# Patient Record
Sex: Female | Born: 1965 | Race: Black or African American | Hispanic: No | Marital: Married | Smoking: Never smoker
Health system: Southern US, Community
[De-identification: ages and names within clinical notes are randomized; demographics above are authoritative.]

## PROBLEM LIST (undated history)

## (undated) DIAGNOSIS — E039 Hypothyroidism, unspecified: Secondary | ICD-10-CM

## (undated) DIAGNOSIS — M1990 Unspecified osteoarthritis, unspecified site: Secondary | ICD-10-CM

## (undated) HISTORY — PX: TUBAL LIGATION: SHX77

## (undated) HISTORY — PX: ABDOMINAL HYSTERECTOMY: SHX81

## (undated) HISTORY — PX: DILATION AND CURETTAGE OF UTERUS: SHX78

## (undated) HISTORY — PX: APPENDECTOMY: SHX54

---

## 2017-02-16 ENCOUNTER — Other Ambulatory Visit: Payer: Self-pay | Admitting: Neurosurgery

## 2017-03-29 ENCOUNTER — Encounter (HOSPITAL_COMMUNITY): Payer: Self-pay | Admitting: *Deleted

## 2017-03-29 ENCOUNTER — Encounter (HOSPITAL_COMMUNITY)
Admission: RE | Admit: 2017-03-29 | Discharge: 2017-03-29 | Disposition: A | Discharge status: Home / Self Care | Payer: PRIVATE HEALTH INSURANCE | Source: Ambulatory Visit | Attending: Neurosurgery | Admitting: Neurosurgery

## 2017-03-29 HISTORY — DX: Unspecified osteoarthritis, unspecified site: M19.90

## 2017-03-29 HISTORY — DX: Hypothyroidism, unspecified: E03.9

## 2017-03-29 LAB — BASIC METABOLIC PANEL
Anion gap: 6 (ref 5–15)
BUN: 12 mg/dL (ref 6–20)
CALCIUM: 9.4 mg/dL (ref 8.9–10.3)
CHLORIDE: 106 mmol/L (ref 101–111)
CO2: 24 mmol/L (ref 22–32)
CREATININE: 0.94 mg/dL (ref 0.44–1.00)
GFR calc non Af Amer: >60 mL/min (ref >60)
Glucose, Bld: 90 mg/dL (ref 65–99)
Potassium: 4.3 mmol/L (ref 3.5–5.1)
SODIUM: 136 mmol/L (ref 135–145)

## 2017-03-29 LAB — TYPE AND SCREEN
ABO/RH(D): B POS
Antibody Screen: NEGATIVE

## 2017-03-29 LAB — CBC
HCT: 38.3 % (ref 36.0–46.0)
Hemoglobin: 12.5 g/dL (ref 12.0–15.0)
MCH: 28 pg (ref 26.0–34.0)
MCHC: 32.6 g/dL (ref 30.0–36.0)
MCV: 85.9 fL (ref 78.0–100.0)
PLATELETS: 347 10*3/uL (ref 150–400)
RBC: 4.46 MIL/uL (ref 3.87–5.11)
RDW: 13.2 % (ref 11.5–15.5)
WBC: 4.2 10*3/uL (ref 4.0–10.5)

## 2017-03-29 LAB — SURGICAL PCR SCREEN
MRSA, PCR: NEGATIVE
Staphylococcus aureus: NEGATIVE

## 2017-03-29 LAB — ABO/RH: ABO/RH(D): B POS

## 2017-03-29 NOTE — Pre-Procedure Instructions (Signed)
Jody Shepard  03/29/2017      [id number]- [place], [place] - [address] [facility] [address] [place] [id number] Phone: [phone] Fax: [phone]    Your procedure is scheduled on September 5  [facility] [employer]  Call this number if you have problems the morning of surgery:  [phone]   Remember:  Do not eat food or drink liquids after midnight.  Continue all other medications as directed by your physician except follow these instructions about you medications   Take these medicines the morning of surgery with A SIP OF WATER  levothyroxine (SYNTHROID, LEVOTHROID)   Do not wear jewelry, make-up or nail polish.  Do not wear lotions, powders, or perfumes, or deoderant.  Do not shave 48 hours prior to surgery.  Men may shave face and neck.  Do not bring valuables to the hospital.  [facility] is not responsible for any belongings or valuables.  Contacts, dentures or bridgework may not be worn into surgery.  Leave your suitcase in the car.  After surgery it may be brought to your room.  For patients admitted to the hospital, discharge time will be determined by your treatment team.  Patients discharged the day of surgery will not be allowed to drive home.   Special instructions:   [facility]- Preparing For Surgery  Before surgery, you can play an important role. Because skin is not sterile, your skin needs to be as free of germs as possible. You can reduce the number of germs on your skin by washing with CHG (chlorahexidine gluconate) Soap before surgery.  CHG is an antiseptic cleaner which kills germs and bonds with the skin to continue killing germs even after washing.  Please do not use if you have an allergy to CHG or antibacterial soaps. If your skin becomes reddened/irritated stop using the CHG.  Do not shave (including legs and underarms) for at least 48 hours prior to first CHG shower.  It is OK to shave your face.  Please follow these instructions carefully.   1. Shower the NIGHT BEFORE SURGERY and the MORNING OF SURGERY with CHG.   2. If you chose to wash your hair, wash your hair first as usual with your normal shampoo.  3. After you shampoo, rinse your hair and body thoroughly to remove the shampoo.  4. Use CHG as you would any other liquid soap. You can apply CHG directly to the skin and wash gently with a scrungie or a clean washcloth.   5. Apply the CHG Soap to your body ONLY FROM THE NECK DOWN.  Do not use on open wounds or open sores. Avoid contact with your eyes, ears, mouth and genitals (private parts). Wash genitals (private parts) with your normal soap.  6. Wash thoroughly, paying special attention to the area where your surgery will be performed.  7. Thoroughly rinse your body with warm water from the neck down.  8. DO NOT shower/wash with your normal soap after using and rinsing off the CHG Soap.  9. Pat yourself dry with a CLEAN TOWEL.   10. Wear CLEAN PAJAMAS   11. Place CLEAN SHEETS on your bed the night of your first shower and DO NOT SLEEP WITH PETS.    Day of Surgery: Do not apply any deodorants/lotions. Please wear clean clothes to the hospital/surgery center.      Please read over the following  fact sheets that you were given.

## 2017-03-29 NOTE — Progress Notes (Signed)
PCP - Renaldo Harrisonaniel Addis Cardiologist - denies  Chest x-ray - not needed EKG - not needed Stress Test - denies ECHO - denies Cardiac Cath -denies     Patient denies shortness of breath, fever, cough and chest pain at PAT appointment   Patient verbalized understanding of instructions that were given to them at the PAT appointment. Patient was also instructed that they will need to review over the PAT instructions again at home before surgery.

## 2017-03-30 ENCOUNTER — Inpatient Hospital Stay (HOSPITAL_COMMUNITY)
Admission: RE | Admit: 2017-03-30 | Discharge: 2017-04-01 | DRG: 454 | Disposition: A | Discharge status: Home / Self Care | Payer: PRIVATE HEALTH INSURANCE | Source: Ambulatory Visit | Attending: Neurosurgery | Admitting: Neurosurgery

## 2017-03-30 ENCOUNTER — Encounter (HOSPITAL_COMMUNITY): Payer: Self-pay

## 2017-03-30 ENCOUNTER — Inpatient Hospital Stay (HOSPITAL_COMMUNITY): Payer: PRIVATE HEALTH INSURANCE | Admitting: Certified Registered"

## 2017-03-30 ENCOUNTER — Inpatient Hospital Stay (HOSPITAL_COMMUNITY): Payer: PRIVATE HEALTH INSURANCE

## 2017-03-30 ENCOUNTER — Encounter (HOSPITAL_COMMUNITY)
Admission: RE | Disposition: A | Discharge status: Home / Self Care | Payer: Self-pay | Source: Ambulatory Visit | Attending: Neurosurgery

## 2017-03-30 DIAGNOSIS — M48062 Spinal stenosis, lumbar region with neurogenic claudication: Secondary | ICD-10-CM | POA: Diagnosis present

## 2017-03-30 DIAGNOSIS — M5116 Intervertebral disc disorders with radiculopathy, lumbar region: Secondary | ICD-10-CM | POA: Diagnosis present

## 2017-03-30 DIAGNOSIS — Z8739 Personal history of other diseases of the musculoskeletal system and connective tissue: Secondary | ICD-10-CM | POA: Diagnosis not present

## 2017-03-30 DIAGNOSIS — Z79899 Other long term (current) drug therapy: Secondary | ICD-10-CM

## 2017-03-30 DIAGNOSIS — E039 Hypothyroidism, unspecified: Secondary | ICD-10-CM | POA: Diagnosis present

## 2017-03-30 DIAGNOSIS — M4316 Spondylolisthesis, lumbar region: Secondary | ICD-10-CM | POA: Diagnosis present

## 2017-03-30 DIAGNOSIS — Z6841 Body Mass Index (BMI) 40.0 and over, adult: Secondary | ICD-10-CM

## 2017-03-30 DIAGNOSIS — Z419 Encounter for procedure for purposes other than remedying health state, unspecified: Secondary | ICD-10-CM

## 2017-03-30 DIAGNOSIS — M4690 Unspecified inflammatory spondylopathy, site unspecified: Secondary | ICD-10-CM | POA: Diagnosis present

## 2017-03-30 SURGERY — POSTERIOR LUMBAR FUSION 1 LEVEL
Anesthesia: General | Site: Spine Lumbar

## 2017-03-30 MED ORDER — CHLORHEXIDINE GLUCONATE CLOTH 2 % EX PADS: 6.0000 | MEDICATED_PAD | Freq: Once | CUTANEOUS | Status: DC

## 2017-03-30 MED ORDER — PROPOFOL 10 MG/ML IV BOLUS
INTRAVENOUS | Status: AC
  Filled 2017-03-30: qty 20

## 2017-03-30 MED ORDER — FENTANYL CITRATE (PF) 100 MCG/2ML IJ SOLN
INTRAMUSCULAR | Status: DC | PRN
  Administered 2017-03-30 (×4): 50 ug via INTRAVENOUS
  Administered 2017-03-30: 150 ug via INTRAVENOUS
  Administered 2017-03-30 (×3): 50 ug via INTRAVENOUS

## 2017-03-30 MED ORDER — PHENOL 1.4 % MT LIQD: 1.0000 | OROMUCOSAL | Status: DC | PRN

## 2017-03-30 MED ORDER — BISACODYL 10 MG RE SUPP: 10.0000 mg | Freq: Every day | RECTAL | Status: DC | PRN

## 2017-03-30 MED ORDER — ONDANSETRON HCL 4 MG PO TABS
4.0000 mg | ORAL_TABLET | Freq: Four times a day (QID) | ORAL | Status: DC | PRN
  Administered 2017-03-30: 4 mg via ORAL
  Filled 2017-03-30: qty 1

## 2017-03-30 MED ORDER — ROCURONIUM BROMIDE 100 MG/10ML IV SOLN
INTRAVENOUS | Status: DC | PRN
  Administered 2017-03-30: 40 mg via INTRAVENOUS
  Administered 2017-03-30: 60 mg via INTRAVENOUS

## 2017-03-30 MED ORDER — VANCOMYCIN HCL 1000 MG IV SOLR
INTRAVENOUS | Status: AC
  Filled 2017-03-30: qty 1000

## 2017-03-30 MED ORDER — ONDANSETRON HCL 4 MG/2ML IJ SOLN
4.0000 mg | Freq: Four times a day (QID) | INTRAMUSCULAR | Status: DC | PRN
Start: 2017-03-30 — End: 2017-04-01

## 2017-03-30 MED ORDER — THROMBIN 5000 UNITS EX SOLR
OROMUCOSAL | Status: DC | PRN
  Administered 2017-03-30: 5 mL via TOPICAL

## 2017-03-30 MED ORDER — THROMBIN 5000 UNITS EX SOLR
CUTANEOUS | Status: AC
  Filled 2017-03-30: qty 5000

## 2017-03-30 MED ORDER — DEXAMETHASONE SODIUM PHOSPHATE 10 MG/ML IJ SOLN
INTRAMUSCULAR | Status: DC | PRN
  Administered 2017-03-30: 10 mg via INTRAVENOUS

## 2017-03-30 MED ORDER — HYDROMORPHONE HCL 1 MG/ML IJ SOLN
INTRAMUSCULAR | Status: AC
  Filled 2017-03-30: qty 1

## 2017-03-30 MED ORDER — SODIUM CHLORIDE 0.9% FLUSH: 3.0000 mL | INTRAVENOUS | Status: DC | PRN

## 2017-03-30 MED ORDER — HYDROMORPHONE HCL 1 MG/ML IJ SOLN
0.2500 mg | INTRAMUSCULAR | Status: DC | PRN
  Administered 2017-03-30 (×4): 0.5 mg via INTRAVENOUS

## 2017-03-30 MED ORDER — PHENYLEPHRINE HCL 10 MG/ML IJ SOLN
INTRAMUSCULAR | Status: DC | PRN
  Administered 2017-03-30: 160 ug via INTRAVENOUS
  Administered 2017-03-30 (×2): 120 ug via INTRAVENOUS

## 2017-03-30 MED ORDER — MORPHINE SULFATE (PF) 4 MG/ML IV SOLN
4.0000 mg | INTRAVENOUS | Status: DC | PRN
  Administered 2017-03-30 – 2017-03-31 (×2): 4 mg via INTRAVENOUS
  Filled 2017-03-30 (×3): qty 1

## 2017-03-30 MED ORDER — VANCOMYCIN HCL 1000 MG IV SOLR
INTRAVENOUS | Status: DC | PRN
  Administered 2017-03-30: 1000 mg

## 2017-03-30 MED ORDER — BUPIVACAINE-EPINEPHRINE (PF) 0.5% -1:200000 IJ SOLN
INTRAMUSCULAR | Status: DC | PRN
  Administered 2017-03-30: 10 mL

## 2017-03-30 MED ORDER — SODIUM CHLORIDE 0.9% FLUSH: 3.0000 mL | Freq: Two times a day (BID) | INTRAVENOUS | Status: DC

## 2017-03-30 MED ORDER — ROCURONIUM BROMIDE 10 MG/ML (PF) SYRINGE
PREFILLED_SYRINGE | INTRAVENOUS | Status: AC
  Filled 2017-03-30: qty 5

## 2017-03-30 MED ORDER — LIDOCAINE 2% (20 MG/ML) 5 ML SYRINGE
INTRAMUSCULAR | Status: AC
  Filled 2017-03-30: qty 5

## 2017-03-30 MED ORDER — POTASSIUM CHLORIDE IN NACL 20-0.9 MEQ/L-% IV SOLN: INTRAVENOUS | Status: DC

## 2017-03-30 MED ORDER — LIDOCAINE HCL (CARDIAC) 20 MG/ML IV SOLN
INTRAVENOUS | Status: DC | PRN
  Administered 2017-03-30: 70 mg via INTRAVENOUS

## 2017-03-30 MED ORDER — CYCLOBENZAPRINE HCL 10 MG PO TABS
ORAL_TABLET | ORAL | Status: AC
  Filled 2017-03-30: qty 1

## 2017-03-30 MED ORDER — OXYCODONE HCL 5 MG PO TABS
ORAL_TABLET | ORAL | Status: AC
  Filled 2017-03-30: qty 1

## 2017-03-30 MED ORDER — FENTANYL CITRATE (PF) 250 MCG/5ML IJ SOLN
INTRAMUSCULAR | Status: AC
  Filled 2017-03-30: qty 5

## 2017-03-30 MED ORDER — ZOLPIDEM TARTRATE 5 MG PO TABS: 5.0000 mg | ORAL_TABLET | Freq: Every evening | ORAL | Status: DC | PRN

## 2017-03-30 MED ORDER — DOCUSATE SODIUM 100 MG PO CAPS
100.0000 mg | ORAL_CAPSULE | Freq: Two times a day (BID) | ORAL | Status: DC
  Administered 2017-03-30 – 2017-04-01 (×4): 100 mg via ORAL
  Filled 2017-03-30 (×4): qty 1

## 2017-03-30 MED ORDER — PROPOFOL 10 MG/ML IV BOLUS
INTRAVENOUS | Status: DC | PRN
  Administered 2017-03-30: 130 mg via INTRAVENOUS
  Administered 2017-03-30: 20 mg via INTRAVENOUS

## 2017-03-30 MED ORDER — THROMBIN 20000 UNITS EX SOLR
CUTANEOUS | Status: DC | PRN
  Administered 2017-03-30: 20 mL via TOPICAL

## 2017-03-30 MED ORDER — OXYCODONE HCL 5 MG PO TABS
5.0000 mg | ORAL_TABLET | Freq: Once | ORAL | Status: AC | PRN
  Administered 2017-03-30: 5 mg via ORAL

## 2017-03-30 MED ORDER — SUGAMMADEX SODIUM 200 MG/2ML IV SOLN
INTRAVENOUS | Status: AC
  Filled 2017-03-30: qty 2

## 2017-03-30 MED ORDER — BUPIVACAINE LIPOSOME 1.3 % IJ SUSP
20.0000 mL | INTRAMUSCULAR | Status: AC
  Administered 2017-03-30: 20 mL
  Filled 2017-03-30: qty 20

## 2017-03-30 MED ORDER — BACITRACIN ZINC 500 UNIT/GM EX OINT
TOPICAL_OINTMENT | CUTANEOUS | Status: DC | PRN
  Administered 2017-03-30: 1 via TOPICAL

## 2017-03-30 MED ORDER — CEFAZOLIN SODIUM-DEXTROSE 2-4 GM/100ML-% IV SOLN
2.0000 g | Freq: Three times a day (TID) | INTRAVENOUS | Status: AC
  Administered 2017-03-30 – 2017-03-31 (×2): 2 g via INTRAVENOUS
  Filled 2017-03-30 (×2): qty 100

## 2017-03-30 MED ORDER — SODIUM CHLORIDE 0.9 % IR SOLN
Status: DC | PRN
  Administered 2017-03-30: 500 mL

## 2017-03-30 MED ORDER — OXYCODONE HCL 5 MG PO TABS
5.0000 mg | ORAL_TABLET | ORAL | Status: DC | PRN
  Administered 2017-03-30 – 2017-03-31 (×3): 10 mg via ORAL
  Filled 2017-03-30 (×3): qty 2

## 2017-03-30 MED ORDER — BACITRACIN ZINC 500 UNIT/GM EX OINT
TOPICAL_OINTMENT | CUTANEOUS | Status: AC
  Filled 2017-03-30: qty 28.35

## 2017-03-30 MED ORDER — ONDANSETRON HCL 4 MG/2ML IJ SOLN
INTRAMUSCULAR | Status: AC
  Filled 2017-03-30: qty 2

## 2017-03-30 MED ORDER — ACETAMINOPHEN 650 MG RE SUPP: 650.0000 mg | RECTAL | Status: DC | PRN

## 2017-03-30 MED ORDER — BUPIVACAINE-EPINEPHRINE (PF) 0.5% -1:200000 IJ SOLN
INTRAMUSCULAR | Status: AC
  Filled 2017-03-30: qty 30

## 2017-03-30 MED ORDER — ONDANSETRON HCL 4 MG/2ML IJ SOLN
INTRAMUSCULAR | Status: DC | PRN
  Administered 2017-03-30: 4 mg via INTRAVENOUS

## 2017-03-30 MED ORDER — CEFAZOLIN SODIUM-DEXTROSE 2-4 GM/100ML-% IV SOLN
2.0000 g | INTRAVENOUS | Status: AC
  Administered 2017-03-30: 2 g via INTRAVENOUS
  Filled 2017-03-30: qty 100

## 2017-03-30 MED ORDER — SUGAMMADEX SODIUM 200 MG/2ML IV SOLN
INTRAVENOUS | Status: DC | PRN
  Administered 2017-03-30: 220 mg via INTRAVENOUS

## 2017-03-30 MED ORDER — PHENYLEPHRINE HCL 10 MG/ML IJ SOLN
INTRAMUSCULAR | Status: AC
  Filled 2017-03-30: qty 1

## 2017-03-30 MED ORDER — MIDAZOLAM HCL 2 MG/2ML IJ SOLN
INTRAMUSCULAR | Status: AC
  Filled 2017-03-30: qty 2

## 2017-03-30 MED ORDER — OXYCODONE HCL 5 MG/5ML PO SOLN: 5.0000 mg | Freq: Once | ORAL | Status: AC | PRN

## 2017-03-30 MED ORDER — 0.9 % SODIUM CHLORIDE (POUR BTL) OPTIME
TOPICAL | Status: DC | PRN
  Administered 2017-03-30: 1000 mL

## 2017-03-30 MED ORDER — THROMBIN 20000 UNITS EX SOLR
CUTANEOUS | Status: AC
  Filled 2017-03-30: qty 20000

## 2017-03-30 MED ORDER — ACETAMINOPHEN 325 MG PO TABS: 650.0000 mg | ORAL_TABLET | ORAL | Status: DC | PRN

## 2017-03-30 MED ORDER — LEVOTHYROXINE SODIUM 75 MCG PO TABS
75.0000 ug | ORAL_TABLET | Freq: Every day | ORAL | Status: DC
  Administered 2017-03-31 – 2017-04-01 (×2): 75 ug via ORAL
  Filled 2017-03-30 (×2): qty 1

## 2017-03-30 MED ORDER — LACTATED RINGERS IV SOLN
INTRAVENOUS | Status: DC | PRN
  Administered 2017-03-30 (×2): via INTRAVENOUS

## 2017-03-30 MED ORDER — MENTHOL 3 MG MT LOZG: 1.0000 | LOZENGE | OROMUCOSAL | Status: DC | PRN

## 2017-03-30 MED ORDER — DEXTROSE 5 % IV SOLN
INTRAVENOUS | Status: DC | PRN
  Administered 2017-03-30: 40 ug/min via INTRAVENOUS

## 2017-03-30 MED ORDER — CYCLOBENZAPRINE HCL 10 MG PO TABS
10.0000 mg | ORAL_TABLET | Freq: Three times a day (TID) | ORAL | Status: DC | PRN
  Administered 2017-03-30 – 2017-03-31 (×3): 10 mg via ORAL
  Filled 2017-03-30 (×2): qty 1

## 2017-03-30 MED ORDER — MIDAZOLAM HCL 5 MG/5ML IJ SOLN
INTRAMUSCULAR | Status: DC | PRN
  Administered 2017-03-30: 2 mg via INTRAVENOUS

## 2017-03-30 SURGICAL SUPPLY — 70 items
BAG DECANTER FOR FLEXI CONT (MISCELLANEOUS) ×3 IMPLANT
BASKET BONE COLLECTION (BASKET) ×3 IMPLANT
BENZOIN TINCTURE PRP APPL 2/3 (GAUZE/BANDAGES/DRESSINGS) ×3 IMPLANT
BLADE CLIPPER SURG (BLADE) IMPLANT
BUR MATCHSTICK NEURO 3.0 LAGG (BURR) ×3 IMPLANT
BUR PRECISION FLUTE 6.0 (BURR) ×3 IMPLANT
CAGE ALTERA 10X31X9-13 15D (Cage) ×2 IMPLANT
CAGE ALTERA 9-13-15-31MM (Cage) ×1 IMPLANT
CANISTER SUCT 3000ML PPV (MISCELLANEOUS) ×3 IMPLANT
CAP REVERE LOCKING (Cap) ×12 IMPLANT
CARTRIDGE OIL MAESTRO DRILL (MISCELLANEOUS) ×1 IMPLANT
CLOSURE STERI-STRIP 1/2X4 (GAUZE/BANDAGES/DRESSINGS) ×1
CLOSURE WOUND 1/2 X4 (GAUZE/BANDAGES/DRESSINGS) ×1
CLSR STERI-STRIP ANTIMIC 1/2X4 (GAUZE/BANDAGES/DRESSINGS) ×2 IMPLANT
CONT SPEC 4OZ CLIKSEAL STRL BL (MISCELLANEOUS) ×3 IMPLANT
COVER BACK TABLE 60X90IN (DRAPES) ×3 IMPLANT
DIFFUSER DRILL AIR PNEUMATIC (MISCELLANEOUS) ×3 IMPLANT
DRAPE C-ARM 42X72 X-RAY (DRAPES) ×6 IMPLANT
DRAPE HALF SHEET 40X57 (DRAPES) ×6 IMPLANT
DRAPE LAPAROTOMY 100X72X124 (DRAPES) ×3 IMPLANT
DRAPE POUCH INSTRU U-SHP 10X18 (DRAPES) ×3 IMPLANT
DRAPE SURG 17X23 STRL (DRAPES) ×12 IMPLANT
ELECT BLADE 4.0 EZ CLEAN MEGAD (MISCELLANEOUS) ×3
ELECT REM PT RETURN 9FT ADLT (ELECTROSURGICAL) ×3
ELECTRODE BLDE 4.0 EZ CLN MEGD (MISCELLANEOUS) ×1 IMPLANT
ELECTRODE REM PT RTRN 9FT ADLT (ELECTROSURGICAL) ×1 IMPLANT
EVACUATOR 1/8 PVC DRAIN (DRAIN) IMPLANT
GAUZE SPONGE 4X4 12PLY STRL (GAUZE/BANDAGES/DRESSINGS) ×3 IMPLANT
GAUZE SPONGE 4X4 16PLY XRAY LF (GAUZE/BANDAGES/DRESSINGS) IMPLANT
GLOVE BIO SURGEON STRL SZ8 (GLOVE) ×6 IMPLANT
GLOVE BIO SURGEON STRL SZ8.5 (GLOVE) ×6 IMPLANT
GLOVE BIOGEL PI IND STRL 7.0 (GLOVE) ×3 IMPLANT
GLOVE BIOGEL PI IND STRL 7.5 (GLOVE) ×2 IMPLANT
GLOVE BIOGEL PI INDICATOR 7.0 (GLOVE) ×6
GLOVE BIOGEL PI INDICATOR 7.5 (GLOVE) ×4
GLOVE ECLIPSE 7.5 STRL STRAW (GLOVE) ×6 IMPLANT
GLOVE EXAM NITRILE LRG STRL (GLOVE) IMPLANT
GLOVE EXAM NITRILE XL STR (GLOVE) IMPLANT
GLOVE EXAM NITRILE XS STR PU (GLOVE) IMPLANT
GOWN STRL REUS W/ TWL LRG LVL3 (GOWN DISPOSABLE) IMPLANT
GOWN STRL REUS W/ TWL XL LVL3 (GOWN DISPOSABLE) ×5 IMPLANT
GOWN STRL REUS W/TWL 2XL LVL3 (GOWN DISPOSABLE) IMPLANT
GOWN STRL REUS W/TWL LRG LVL3 (GOWN DISPOSABLE)
GOWN STRL REUS W/TWL XL LVL3 (GOWN DISPOSABLE) ×10
KIT BASIN OR (CUSTOM PROCEDURE TRAY) ×3 IMPLANT
KIT ROOM TURNOVER OR (KITS) ×3 IMPLANT
NEEDLE HYPO 21X1.5 SAFETY (NEEDLE) ×3 IMPLANT
NEEDLE HYPO 22GX1.5 SAFETY (NEEDLE) ×3 IMPLANT
NS IRRIG 1000ML POUR BTL (IV SOLUTION) ×3 IMPLANT
OIL CARTRIDGE MAESTRO DRILL (MISCELLANEOUS) ×3
PACK LAMINECTOMY NEURO (CUSTOM PROCEDURE TRAY) ×3 IMPLANT
PAD ARMBOARD 7.5X6 YLW CONV (MISCELLANEOUS) ×15 IMPLANT
PATTIES SURGICAL .5 X1 (DISPOSABLE) IMPLANT
ROD REVERE 6.35 45MM (Rod) ×6 IMPLANT
SCREW REVERE 5.5X45 (Screw) ×3 IMPLANT
SCREW REVERE 6.35 6.5MMX45 (Screw) ×3 IMPLANT
SCREW REVERE 6.5X50MM (Screw) ×6 IMPLANT
SPONGE LAP 4X18 X RAY DECT (DISPOSABLE) IMPLANT
SPONGE NEURO XRAY DETECT 1X3 (DISPOSABLE) IMPLANT
SPONGE SURGIFOAM ABS GEL 100 (HEMOSTASIS) ×3 IMPLANT
STRIP BIOACTIVE 20CC 25X100X8 (Miscellaneous) ×3 IMPLANT
STRIP CLOSURE SKIN 1/2X4 (GAUZE/BANDAGES/DRESSINGS) ×2 IMPLANT
SUT VIC AB 1 CT1 18XBRD ANBCTR (SUTURE) ×2 IMPLANT
SUT VIC AB 1 CT1 8-18 (SUTURE) ×4
SUT VIC AB 2-0 CP2 18 (SUTURE) ×6 IMPLANT
TAPE CLOTH SURG 4X10 WHT LF (GAUZE/BANDAGES/DRESSINGS) ×3 IMPLANT
TOWEL GREEN STERILE (TOWEL DISPOSABLE) ×3 IMPLANT
TOWEL GREEN STERILE FF (TOWEL DISPOSABLE) ×3 IMPLANT
TRAY FOLEY W/METER SILVER 16FR (SET/KITS/TRAYS/PACK) ×3 IMPLANT
WATER STERILE IRR 1000ML POUR (IV SOLUTION) ×3 IMPLANT

## 2017-03-30 NOTE — Anesthesia Preprocedure Evaluation (Signed)
Anesthesia Evaluation  Patient identified by MRN, date of birth, ID band Patient awake    Reviewed: Allergy & Precautions, NPO status , Patient's Chart, lab work & pertinent test results  History of Anesthesia Complications Negative for: history of anesthetic complications  Airway Mallampati: II  TM Distance: >3 FB Neck ROM: Full    Dental   Pulmonary neg pulmonary ROS,    breath sounds clear to auscultation       Cardiovascular negative cardio ROS   Rhythm:Regular     Neuro/Psych negative neurological ROS  negative psych ROS   GI/Hepatic negative GI ROS, Neg liver ROS,   Endo/Other  Hypothyroidism Morbid obesity  Renal/GU negative Renal ROS     Musculoskeletal  (+) Arthritis ,   Abdominal   Peds  Hematology negative hematology ROS (+)   Anesthesia Other Findings   Reproductive/Obstetrics                             Anesthesia Physical Anesthesia Plan  ASA: II  Anesthesia Plan: General   Post-op Pain Management:    Induction: Intravenous  PONV Risk Score and Plan: 3 and Ondansetron, Dexamethasone, Midazolam and Propofol infusion  Airway Management Planned: Oral ETT  Additional Equipment: None  Intra-op Plan:   Post-operative Plan: Extubation in OR  Informed Consent: I have reviewed the patients History and Physical, chart, labs and discussed the procedure including the risks, benefits and alternatives for the proposed anesthesia with the patient or authorized representative who has indicated his/her understanding and acceptance.   Dental advisory given  Plan Discussed with: CRNA and Surgeon  Anesthesia Plan Comments:         Anesthesia Quick Evaluation

## 2017-03-30 NOTE — Op Note (Signed)
Brief history: The patient is a 51 year old black female who has complained of back and leg pain consistent with neurogenic claudication. She has failed medical management and was worked up with a lumbar MRI and lumbar x-rays. These studies demonstrated an L4-5 spondylolisthesis with spinal stenosis. I discussed the various treatment options with the patient. She has decided to proceed with surgery after weighing the risks, benefits, and alternatives to surgery.  Preoperative diagnosis: L4-5 spondylolisthesis, facet arthropathy, Degenerative disc disease, spinal stenosis compressing both the L4 and the L5 nerve roots; lumbago; lumbar radiculopathy; neurogenic claudication  Postoperative diagnosis: The same  Procedure: Bilateral L4-5 Laminotomy/foraminotomies to decompress the bilateral L4 and L5 nerve roots(the work required to do this was in addition to the work required to do the posterior lumbar interbody fusion because of the patient's spinal stenosis, facet arthropathy. Etc. requiring a wide decompression of the nerve roots.); L4-5 transforaminal lumbar interbody fusion with local morselized autograft bone and Kinnex graft extender; insertion of interbody prosthesis at L4-5 (globus peek expandable interbody prosthesis); posterior nonsegmental instrumentation from L4 to L5 with globus titanium pedicle screws and rods; posterior lateral arthrodesis at L4-5 with local morselized autograft bone and Kinnex bone graft extender.  Surgeon: Dr. Delma OfficerJeff Eddie Payette  Asst.: None  Anesthesia: Gen. endotracheal  Estimated blood loss: 200 mL  Drains: None  Complications: None  Description of procedure: The patient was brought to the operating room by the anesthesia team. General endotracheal anesthesia was induced. The patient was turned to the prone position on the Wilson frame. The patient's lumbosacral region was then prepared with Betadine scrub and Betadine solution. Sterile drapes were applied.  I then  injected the area to be incised with Marcaine with epinephrine solution. I then used the scalpel to make a linear midline incision over the L4-5 interspace. I then used electrocautery to perform a bilateral subperiosteal dissection exposing the spinous process and lamina of L4 and L5. We then obtained intraoperative radiograph to confirm our location. We then inserted the Verstrac retractor to provide exposure.  I began the decompression by using the high speed drill to perform laminotomies at L4-5 bilaterally. We then used the Kerrison punches to widen the laminotomy and removed the ligamentum flavum at L4-5 bilaterally. We used the Kerrison punches to remove the medial facets at L4-5 bilaterally. We performed wide foraminotomies about the bilateral L4 and L5 nerve roots completing the decompression.  We now turned our attention to the posterior lumbar interbody fusion. I used a scalpel to incise the intervertebral disc at L4-5 bilaterally. I then performed a partial intervertebral discectomy at L4-5 bilaterally using the pituitary forceps. We prepared the vertebral endplates at L4-5 bilaterally for the fusion by removing the soft tissues with the curettes. We then used the trial spacers to pick the appropriate sized interbody prosthesis. We prefilled his prosthesis with a combination of local morselized autograft bone that we obtained during the decompression as well as Kinnex bone graft extender. We inserted the prefilled prosthesis into the interspace at L4-5, we then expanded the prosthesis. There was a good snug fit of the prosthesis in the interspace. We then filled and the remainder of the intervertebral disc space with local morselized autograft bone and Kinnex. This completed the posterior lumbar interbody arthrodesis.  We now turned attention to the instrumentation. Under fluoroscopic guidance we cannulated the bilateral L4 and L5 pedicles with the bone probe. We then removed the bone probe. We then  tapped the pedicle with a 5.5 millimeter tap. We then  removed the tap. We probed inside the tapped pedicle with a ball probe to rule out cortical breaches. We then inserted a 5.5 and 6.5 x 45 and 50 millimeter pedicle screw into the L4 and L5 pedicles bilaterally under fluoroscopic guidance. We then palpated along the medial aspect of the pedicles to rule out cortical breaches. There were none. The nerve roots were not injured. We then connected the unilateral pedicle screws with a lordotic rod. We compressed the construct and secured the rod in place with the caps. We then tightened the caps appropriately. This completed the instrumentation from L4-5.  We now turned our attention to the posterior lateral arthrodesis at L4-5 bilaterally. We used the high-speed drill to decorticate the remainder of the facets, pars, transverse process at L4-5 bilaterally. We then applied a combination of local morselized autograft bone and Kinnex bone graft extender over these decorticated posterior lateral structures. This completed the posterior lateral arthrodesis.  We then obtained hemostasis using bipolar electrocautery. We irrigated the wound out with bacitracin solution. We inspected the thecal sac and nerve roots and noted they were well decompressed. We then removed the retractor. We placed vancomycin powder in the wound. We reapproximated patient's thoracolumbar fascia with interrupted #1 Vicryl suture. We reapproximated patient's subcutaneous tissue with interrupted 2-0 Vicryl suture. The reapproximated patient's skin with Steri-Strips and benzoin. The wound was then coated with bacitracin ointment. A sterile dressing was applied. The drapes were removed. The patient was subsequently returned to the supine position where they were extubated by the anesthesia team. He was then transported to the post anesthesia care unit in stable condition. All sponge instrument and needle counts were reportedly correct at the end of  this case.

## 2017-03-30 NOTE — Anesthesia Procedure Notes (Signed)
Procedure Name: Intubation Date/Time: 03/30/2017 9:56 AM Performed by: Lance Coon Pre-anesthesia Checklist: Patient identified, Emergency Drugs available, Suction available, Patient being monitored and Timeout performed Patient Re-evaluated:Patient Re-evaluated prior to induction Oxygen Delivery Method: Circle system utilized Preoxygenation: Pre-oxygenation with 100% oxygen Induction Type: IV induction Ventilation: Mask ventilation without difficulty Laryngoscope Size: Mac and 3 Grade View: Grade II Tube type: Oral Tube size: 7.0 mm Number of attempts: 2 Airway Equipment and Method: Stylet Placement Confirmation: ETT inserted through vocal cords under direct vision,  positive ETCO2 and breath sounds checked- equal and bilateral Secured at: 20 cm Tube secured with: Tape Dental Injury: Teeth and Oropharynx as per pre-operative assessment  Comments: Attempt x1 per paramedic, unable to visualize vocal cords

## 2017-03-30 NOTE — H&P (Signed)
Subjective: The patient is a 51 year old obese black female who has complained of back and leg pain consistent with neurogenic claudication. She has failed medical management and was worked up with a lumbar MRI. This demonstrated an L4-5 spondylolisthesis with spinal stenosis. I discussed the various treatment options with the patient. She has decided to proceed with surgery.   Past Medical History:  Diagnosis Date  . Arthritis   . Hypothyroidism     Past Surgical History:  Procedure Laterality Date  . ABDOMINAL HYSTERECTOMY    . APPENDECTOMY    . DILATION AND CURETTAGE OF UTERUS    . TUBAL LIGATION      No Known Allergies  Social History  Substance Use Topics  . Smoking status: Never Smoker  . Smokeless tobacco: Never Used  . Alcohol use Yes     Comment: rarely    History reviewed. No pertinent family history. Prior to Admission medications   Medication Sig Start Date End Date Taking? Authorizing Provider  levothyroxine (SYNTHROID, LEVOTHROID) 75 MCG tablet Take 75 mcg by mouth daily before breakfast.   Yes [provider]  Vitamin D, Ergocalciferol, (DRISDOL) 50000 units CAPS capsule Take 50,000 Units by mouth every 7 (seven) days. Sunday   Yes [provider]  Omega-3 Fatty Acids (FISH OIL PO) Take 1 tablet by mouth 2 (two) times daily.    [provider]  TURMERIC PO Take 1 tablet by mouth 2 (two) times daily.    [provider]     Review of Systems  Positive ROS: As above  All other systems have been reviewed and were otherwise negative with the exception of those mentioned in the HPI and as above.  Objective: Vital signs in last 24 hours: Temp:  [98.2 F (36.8 C)-98.6 F (37 C)] 98.6 F (37 C) (09/05 0710) Pulse Rate:  [67-70] 70 (09/05 0710) Resp:  [20] 20 (09/04 1233) BP: (135-151)/(77-89) 151/77 (09/05 0710) SpO2:  [100 %] 100 % (09/05 0710) Weight:  [108.1 kg (238 lb 5 oz)] 108.1 kg (238 lb 5 oz) (09/05 0710)  General  Appearance: Alert, pleasant, obese Head: Normocephalic, without obvious abnormality, atraumatic Eyes: PERRL, conjunctiva/corneas clear, EOM's intact,    Ears: Normal  Throat: Normal  Neck: Supple, Back: unremarkable Lungs: Clear to auscultation bilaterally, respirations unlabored Heart: Regular rate and rhythm, no murmur, rub or gallop Abdomen: Soft, non-tender Extremities: Extremities normal, atraumatic, no cyanosis or edema Skin: unremarkable  NEUROLOGIC:   Mental status: alert and oriented,Motor Exam - grossly normal Sensory Exam - grossly normal Reflexes:  Coordination - grossly normal Gait - grossly normal Balance - grossly normal Cranial Nerves: I: smell Not tested  II: visual acuity  OS: Normal  OD: Normal   II: visual fields Full to confrontation  II: pupils Equal, round, reactive to light  III,VII: ptosis None  III,IV,VI: extraocular muscles  Full ROM  V: mastication Normal  V: facial light touch sensation  Normal  V,VII: corneal reflex  Present  VII: facial muscle function - upper  Normal  VII: facial muscle function - lower Normal  VIII: hearing Not tested  IX: soft palate elevation  Normal  IX,X: gag reflex Present  XI: trapezius strength  5/5  XI: sternocleidomastoid strength 5/5  XI: neck flexion strength  5/5  XII: tongue strength  Normal    Data Review Lab Results  Component Value Date   WBC 4.2 03/29/2017   HGB 12.5 03/29/2017   HCT 38.3 03/29/2017   MCV 85.9 03/29/2017  PLT 347 03/29/2017   Lab Results  Component Value Date   NA 136 03/29/2017   K 4.3 03/29/2017   CL 106 03/29/2017   CO2 24 03/29/2017   BUN 12 03/29/2017   CREATININE 0.94 03/29/2017   GLUCOSE 90 03/29/2017   No results found for: INR, PROTIME  Assessment/Plan: L4-5 spondylolisthesis, spinal stenosis, neurogenic claudication, lumbar radiculopathy: I have discussed the situation with the patient and reviewed her imaging studies with her. We have discussed the various  treatment options including surgery. I have described the surgical treatment option of an L4-5 decompression, instrumentation, and fusion. I have shown her surgical models. We have discussed the risk, benefits, alternatives, expected postoperative course, and likelihood of achieving our goals with surgery. I have answered all her questions. She has decided to proceed with surgery.   Evie Croston D 03/30/2017 9:37 AM

## 2017-03-30 NOTE — Progress Notes (Signed)
Pt states pain is a 10 then falls asleep

## 2017-03-30 NOTE — Transfer of Care (Signed)
Immediate Anesthesia Transfer of Care Note  Patient: Jody Shepard  Procedure(s) Performed: Procedure(s): POSTERIOR LUMBAR INTERBODY FUSION, INTERBODY PROSTHESIS, POSTERIOR INSTRUMENTATION AND FUSION LUMBAR FOUR- LUMBAR FIVE (N/A)  Patient Location: PACU  Anesthesia Type:General  Level of Consciousness: awake, alert , oriented and patient cooperative  Airway & Oxygen Therapy: Patient Spontanous Breathing  Post-op Assessment: Report given to RN and Post -op Vital signs reviewed and stable  Post vital signs: Reviewed and stable  Last Vitals:  Vitals:   03/30/17 0710  BP: (!) 151/77  Pulse: 70  Temp: 37 C  SpO2: 100%    Last Pain:  Vitals:   03/30/17 0710  TempSrc: Oral      Patients Stated Pain Goal: 5 (03/30/17 0710)  Complications: No apparent anesthesia complications

## 2017-03-31 LAB — CBC
HEMATOCRIT: 31.8 % — AB (ref 36.0–46.0)
HEMOGLOBIN: 10.1 g/dL — AB (ref 12.0–15.0)
MCH: 27.3 pg (ref 26.0–34.0)
MCHC: 31.8 g/dL (ref 30.0–36.0)
MCV: 85.9 fL (ref 78.0–100.0)
Platelets: 282 10*3/uL (ref 150–400)
RBC: 3.7 MIL/uL — ABNORMAL LOW (ref 3.87–5.11)
RDW: 13 % (ref 11.5–15.5)
WBC: 9.9 10*3/uL (ref 4.0–10.5)

## 2017-03-31 LAB — BASIC METABOLIC PANEL
ANION GAP: 8 (ref 5–15)
BUN: 8 mg/dL (ref 6–20)
CALCIUM: 8.6 mg/dL — AB (ref 8.9–10.3)
CHLORIDE: 104 mmol/L (ref 101–111)
CO2: 25 mmol/L (ref 22–32)
Creatinine, Ser: 0.88 mg/dL (ref 0.44–1.00)
GFR calc non Af Amer: >60 mL/min (ref >60)
GLUCOSE: 116 mg/dL — AB (ref 65–99)
Potassium: 4 mmol/L (ref 3.5–5.1)
Sodium: 137 mmol/L (ref 135–145)

## 2017-03-31 MED ORDER — OXYCODONE-ACETAMINOPHEN 5-325 MG PO TABS
1.0000 | ORAL_TABLET | ORAL | Status: DC | PRN
  Administered 2017-03-31 – 2017-04-01 (×6): 2 via ORAL
  Filled 2017-03-31 (×6): qty 2

## 2017-03-31 MED ORDER — METHOCARBAMOL 500 MG PO TABS
500.0000 mg | ORAL_TABLET | Freq: Four times a day (QID) | ORAL | Status: DC | PRN
  Administered 2017-03-31 – 2017-04-01 (×4): 500 mg via ORAL
  Filled 2017-03-31 (×4): qty 1

## 2017-03-31 MED ORDER — MORPHINE SULFATE (PF) 4 MG/ML IV SOLN
4.0000 mg | INTRAVENOUS | Status: DC | PRN
  Administered 2017-03-31: 4 mg via INTRAMUSCULAR

## 2017-03-31 MED FILL — Heparin Sodium (Porcine) Inj 1000 Unit/ML: INTRAMUSCULAR | Qty: 30 | Status: AC

## 2017-03-31 MED FILL — Sodium Chloride IV Soln 0.9%: INTRAVENOUS | Qty: 1000 | Status: AC

## 2017-03-31 NOTE — Evaluation (Signed)
Occupational Therapy Evaluation Patient Details Name: Jody Shepard MRN: [id number] DOB: March 14, 1966 Today's Date: 03/31/2017    History of Present Illness s/p Bilateral L4-5 Laminotomy/foraminotomies to decompress the bilateral L4 and L5 nerve roots   Clinical Impression   Pt admitted with the above diagnoses and presents with below problem list. Pt will benefit from continued acute OT to address the below listed deficits and maximize independence with basic ADLs prior to d/c home. PTA pt was independent with ADLs. Pt is currently min guard with functional mobility, min A for bed mobility and LB ADLs. Multiple family members assisting at home. 10/10 pain limiting this session, nursing notified. Daughter present and included in education.      Follow Up Recommendations  No OT follow up    Equipment Recommendations  3 in 1 bedside commode    Recommendations for Other Services       Precautions / Restrictions Precautions Precautions: Back Precaution Booklet Issued: No (OT provided) Precaution Comments: PT reviewed 3/3 back precautions and handout with pt and family Required Braces or Orthoses: Spinal Brace Spinal Brace: Lumbar corset Restrictions Weight Bearing Restrictions: No      Mobility Bed Mobility Overal bed mobility: (P) Needs Assistance Bed Mobility: (P) Rolling;Sidelying to Sit Rolling: (P) Min guard Sidelying to sit: (P) Min guard     Sit to sidelying: Min assist General bed mobility comments: (P) increased time and effort, cueing for log roll technique, min guard for safety  Transfers Overall transfer level: (P) Needs assistance Equipment used: (P) None Transfers: (P) Sit to/from Stand Sit to Stand: (P) Min guard         General transfer comment: (P) assist to steady balance and for safety.    Balance Overall balance assessment: (P) Needs assistance Sitting-balance support: (P) Feet supported Sitting balance-Leahy Scale: (P) Good     Standing  balance support: (P) No upper extremity supported;During functional activity Standing balance-Leahy Scale: (P) Fair                             ADL either performed or assessed with clinical judgement   ADL Overall ADL's : Needs assistance/impaired Eating/Feeding: Set up;Sitting   Grooming: Min guard;Standing;Wash/dry hands   Upper Body Bathing: Set up;Sitting   Lower Body Bathing: Minimal assistance;Sit to/from stand   Upper Body Dressing : Set up;Sitting   Lower Body Dressing: Minimal assistance;Sit to/from stand   Toilet Transfer: Min guard;Ambulation;RW   Toileting- Clothing Manipulation and Hygiene: Minimal assistance;Sit to/from stand   Tub/ Shower Transfer: Walk-in shower;Minimal assistance;Ambulation;Rolling walker   Functional mobility during ADLs: Min guard;Rolling walker General ADL Comments: Pt completed in-room functional mobility, grooming task at sink, and bed mobility as detailed above.      Vision         Perception     Praxis      Pertinent Vitals/Pain Pain Assessment: 0-10 Pain Score: 10-Worst pain ever Pain Location: (P) back Pain Descriptors / Indicators: (P) Aching;Sore;Spasm Pain Intervention(s): (P) Monitored during session;Repositioned;Premedicated before session     Hand Dominance     Extremity/Trunk Assessment Upper Extremity Assessment Upper Extremity Assessment: (P) Defer to OT evaluation   Lower Extremity Assessment Lower Extremity Assessment: (P) Overall WFL for tasks assessed   Cervical / Trunk Assessment Cervical / Trunk Assessment: (P) Other exceptions Cervical / Trunk Exceptions: (P) s/p lumbar sx   Communication Communication Communication: No difficulties   Cognition Arousal/Alertness: (P) Awake/alert Behavior During Therapy: (  P) WFL for tasks assessed/performed Overall Cognitive Status: (P) Within Functional Limits for tasks assessed                                     General Comments        Exercises     Shoulder Instructions      Home Living Family/patient expects to be discharged to:: Private residence Living Arrangements: Spouse/significant other Available Help at Discharge: Family;Available 24 hours/day Type of Home: House Home Access: Stairs to enter [employer] of Steps: 1   Home Layout: One level     Bathroom Shower/Tub: Chief Strategy Officer: Standard     Home Equipment: None          Prior Functioning/Environment Level of Independence: Independent                 OT Problem List: Impaired balance (sitting and/or standing);Decreased knowledge of use of DME or AE;Decreased knowledge of precautions;Pain      OT Treatment/Interventions: Self-care/ADL training;DME and/or AE instruction;Therapeutic activities;Patient/family education;Balance training    OT Goals(Current goals can be found in the care plan section) Acute Rehab OT Goals Patient Stated Goal: decreased pain OT Goal Formulation: With patient/family Time For Goal Achievement: 04/07/17 Potential to Achieve Goals: Good ADL Goals Pt Will Perform Lower Body Bathing: with modified independence;sit to/from stand Pt Will Perform Lower Body Dressing: with modified independence;sit to/from stand Pt Will Transfer to Toilet: with modified independence;ambulating Pt Will Perform Toileting - Clothing Manipulation and hygiene: with modified independence;sit to/from stand Pt Will Perform Tub/Shower Transfer: with supervision;ambulating;3 in 1;rolling walker Additional ADL Goal #1: Pt will complete bed mobility at supervision level to prepare for OOB ADLs.   OT Frequency: Min 2X/week   Barriers to D/C:            Co-evaluation              AM-PAC PT "6 Clicks" Daily Activity     Outcome Measure Help from another person eating meals?: None Help from another person taking care of personal grooming?: None Help from another person toileting, which includes  using toliet, bedpan, or urinal?: A Little Help from another person bathing (including washing, rinsing, drying)?: A Little Help from another person to put on and taking off regular upper body clothing?: None Help from another person to put on and taking off regular lower body clothing?: A Little 6 Click Score: 21   End of Session Equipment Utilized During Treatment: Rolling walker;Back brace Nurse Communication: Patient requests pain meds  Activity Tolerance: Patient limited by pain Patient left: in bed;with call bell/phone within reach;with nursing/sitter in room;with family/visitor present  OT Visit Diagnosis: Pain                Time: 1610-9604 OT Time Calculation (min): 19 min Charges:  OT General Charges $OT Visit: 1 Visit OT Evaluation $OT Eval Low Complexity: 1 Low G-Codes:       Pilar Grammes 03/31/2017, 12:12 PM

## 2017-03-31 NOTE — Evaluation (Signed)
Physical Therapy Evaluation Patient Details Name: Jody Shepard MRN: [id number] DOB: 1966/07/14 Today's Date: 03/31/2017   History of Present Illness  s/p Bilateral L4-5 Laminotomy/foraminotomies to decompress the bilateral L4 and L5 nerve roots  Clinical Impression  Pt presented supine in bed with HOB elevated, awake and willing to participate in therapy session. Prior to admission, pt reported that she was independent with all functional mobility and ADLs. Pt ambulated in hallway without use of an AD with min guard for safety. Pt with very slow, cautious gait pattern. PT reviewed 3/3 back precautions and log roll technique with pt throughout session. PT will continue to follow acutely to ensure a safe d/c home.     Follow Up Recommendations No PT follow up;Supervision/Assistance - 24 hour    Equipment Recommendations  None recommended by PT    Recommendations for Other Services       Precautions / Restrictions Precautions Precautions: Back Precaution Booklet Issued: No (OT provided) Precaution Comments: PT reviewed 3/3 back precautions and handout with pt and family Required Braces or Orthoses: Spinal Brace Spinal Brace: Lumbar corset Restrictions Weight Bearing Restrictions: No      Mobility  Bed Mobility Overal bed mobility: Needs Assistance Bed Mobility: Rolling;Sidelying to Sit Rolling: Min guard Sidelying to sit: Min guard     Sit to sidelying: Min assist General bed mobility comments: increased time and effort, cueing for log roll technique, min guard for safety  Transfers Overall transfer level: Needs assistance Equipment used: None Transfers: Sit to/from Stand Sit to Stand: Min guard         General transfer comment: assist to steady balance and for safety.  Ambulation/Gait Ambulation/Gait assistance: Min guard Ambulation Distance (Feet): 300 Feet Assistive device: None Gait Pattern/deviations: Step-through pattern;Decreased step length -  right;Decreased step length - left;Decreased stride length;Shuffle;Wide base of support Gait velocity: decreased Gait velocity interpretation: Below normal speed for age/gender General Gait Details: very slow, cautious gait pattern, mild instability but no overt LOB or need for physical assistance, min guard for safety  Stairs            Wheelchair Mobility    Modified Rankin (Stroke Patients Only)       Balance Overall balance assessment: Needs assistance Sitting-balance support: Feet supported Sitting balance-Leahy Scale: Good     Standing balance support: No upper extremity supported;During functional activity Standing balance-Leahy Scale: Fair                               Pertinent Vitals/Pain Pain Assessment: 0-10 Pain Score: 10-Worst pain ever Pain Location: back Pain Descriptors / Indicators: Aching;Sore;Spasm Pain Intervention(s): Monitored during session;Repositioned;Premedicated before session    Home Living Family/patient expects to be discharged to:: Private residence Living Arrangements: Spouse/significant other Available Help at Discharge: Family;Available 24 hours/day Type of Home: House Home Access: Stairs to enter   [employer]trance Stairs-Number of Steps: 1 Home Layout: One level Home Equipment: None      Prior Function Level of Independence: Independent               Hand Dominance        Extremity/Trunk Assessment   Upper Extremity Assessment Upper Extremity Assessment: Defer to OT evaluation    Lower Extremity Assessment Lower Extremity Assessment: Overall WFL for tasks assessed    Cervical / Trunk Assessment Cervical / Trunk Assessment: Other exceptions Cervical / Trunk Exceptions: s/p lumbar sx  Communication   Communication: No  difficulties  Cognition Arousal/Alertness: Awake/alert Behavior During Therapy: WFL for tasks assessed/performed Overall Cognitive Status: Within Functional Limits for tasks assessed                                         General Comments      Exercises     Assessment/Plan    PT Assessment Patient needs continued PT services  PT Problem List Decreased balance;Decreased mobility;Decreased coordination;Decreased knowledge of use of DME;Decreased safety awareness;Decreased knowledge of precautions;Pain       PT Treatment Interventions DME instruction;Gait training;Stair training;Functional mobility training;Therapeutic activities;Therapeutic exercise;Balance training;Neuromuscular re-education;Patient/family education    PT Goals (Current goals can be found in the Care Plan section)  Acute Rehab PT Goals Patient Stated Goal: decreased pain PT Goal Formulation: With patient Time For Goal Achievement: 04/07/17 Potential to Achieve Goals: Good    Frequency Min 5X/week   Barriers to discharge        Co-evaluation               AM-PAC PT "6 Clicks" Daily Activity  Outcome Measure Difficulty turning over in bed (including adjusting bedclothes, sheets and blankets)?: A Lot Difficulty moving from lying on back to sitting on the side of the bed? : A Lot Difficulty sitting down on and standing up from a chair with arms (e.g., wheelchair, bedside commode, etc,.)?: A Little Help needed moving to and from a bed to chair (including a wheelchair)?: A Little Help needed walking in hospital room?: A Little Help needed climbing 3-5 steps with a railing? : A Little 6 Click Score: 16    End of Session Equipment Utilized During Treatment: Gait belt;Back brace Activity Tolerance: Patient tolerated treatment well Patient left: in chair;with call bell/phone within reach;with family/visitor present Nurse Communication: Mobility status PT Visit Diagnosis: Unsteadiness on feet (R26.81);Other abnormalities of gait and mobility (R26.89);Pain Pain - part of body:  (back)    Time: 1478-2956 PT Time Calculation (min) (ACUTE ONLY): 17 min   Charges:   PT  Evaluation $PT Eval Moderate Complexity: 1 Mod     PT G Codes:        [place], PT, DPT [phone]   Alessandra Bevels Evoleht Hovatter 03/31/2017, 12:14 PM

## 2017-03-31 NOTE — Progress Notes (Signed)
Patient ID: Jody Shepard, female   DOB: 03-11-1966, 51 y.o.   MRN: [id number] Subjective:  The patient is alert and pleasant. Her back is appropriately sore. She wants to go home tomorrow.  Objective: Vital signs in last 24 hours: Temp:  [97.6 F (36.4 C)-98.9 F (37.2 C)] 98.3 F (36.8 C) (09/06 0804) Pulse Rate:  [71-97] 91 (09/06 0804) Resp:  [12-20] 20 (09/06 0804) BP: (106-154)/(50-81) 115/70 (09/06 0804) SpO2:  [96 %-100 %] 98 % (09/06 0804)  Intake/Output from previous day: 09/05 0701 - 09/06 0700 In: 2800 [P.O.:600; I.V.:1700] Out: 1150 [Urine:650; Emesis/NG output:300; Blood:200] Intake/Output this shift: No intake/output data recorded.  Physical exam the patient is alert and oriented. Her strength is grossly normal.  Lab Results:  Recent Labs  03/29/17 1304 03/31/17 0302  WBC 4.2 9.9  HGB 12.5 10.1*  HCT 38.3 31.8*  PLT 347 282   BMET  Recent Labs  03/29/17 1304 03/31/17 0302  NA 136 137  K 4.3 4.0  CL 106 104  CO2 24 25  GLUCOSE 90 116*  BUN 12 8  CREATININE 0.94 0.88  CALCIUM 9.4 8.6*    Studies/Results: Dg Lumbar Spine 2-3 Views  Result Date: 03/30/2017 CLINICAL DATA:  PLIF EXAM: LUMBAR SPINE - 2-3 VIEW; DG C-ARM 61-120 MIN COMPARISON:  None. FINDINGS: C-arm images show discectomy at L4-5 with posterior decompression, M bilateral pedicle screws. Posterior rods are not yet in place. IMPRESSION: Diskectomy, decompression and fusion in progress at L4-5. Electronically Signed   By: Paulina FusiMark  Shogry M.D.   On: 03/30/2017 13:13   Dg Lumbar Spine 1 View  Result Date: 03/30/2017 CLINICAL DATA:  Intraoperative localization film for patient undergoing lumbar surgery. EXAM: LUMBAR SPINE - 1 VIEW COMPARISON:  None. FINDINGS: Single intraoperative view of the lumbar spine in the lateral projection demonstrates a probe directed toward the L3-4 disc interspace. IMPRESSION: Localization as above. Electronically Signed   By: Drusilla Kannerhomas  Dalessio M.D.   On: 03/30/2017 11:51    Dg C-arm 1-60 Min  Result Date: 03/30/2017 CLINICAL DATA:  PLIF EXAM: LUMBAR SPINE - 2-3 VIEW; DG C-ARM 61-120 MIN COMPARISON:  None. FINDINGS: C-arm images show discectomy at L4-5 with posterior decompression, M bilateral pedicle screws. Posterior rods are not yet in place. IMPRESSION: Diskectomy, decompression and fusion in progress at L4-5. Electronically Signed   By: Paulina FusiMark  Shogry M.D.   On: 03/30/2017 13:13    Assessment/Plan: Postop day #1: The patient is doing well. We'll continue to mobilize with PT. She may go home tomorrow.  LOS: 1 day     Aryahi Denzler D 03/31/2017, 12:04 PM

## 2017-03-31 NOTE — Anesthesia Postprocedure Evaluation (Signed)
Anesthesia Post Note  Patient: Jody Shepard  Procedure(s) Performed: Procedure(s) (LRB): POSTERIOR LUMBAR INTERBODY FUSION, INTERBODY PROSTHESIS, POSTERIOR INSTRUMENTATION AND FUSION LUMBAR FOUR- LUMBAR FIVE (N/A)     Patient location during evaluation: PACU Anesthesia Type: General Level of consciousness: awake and patient cooperative Pain management: pain level controlled Vital Signs Assessment: post-procedure vital signs reviewed and stable Respiratory status: spontaneous breathing, nonlabored ventilation, respiratory function stable and patient connected to nasal cannula oxygen Cardiovascular status: blood pressure returned to baseline and stable Postop Assessment: no signs of nausea or vomiting Anesthetic complications: no    Last Vitals:  Vitals:   03/30/17 2352 03/31/17 0400  BP: (!) 125/52 (!) 106/50  Pulse: 74 80  Resp: 18 20  Temp: 37.2 C 37 C  SpO2: 100% 100%    Last Pain:  Vitals:   03/31/17 0621  TempSrc:   PainSc: 8                  Hosie Sharman

## 2017-04-01 MED ORDER — OXYCODONE-ACETAMINOPHEN 5-325 MG PO TABS: 1.0000 | ORAL_TABLET | ORAL | 0 refills | Status: AC | PRN

## 2017-04-01 MED ORDER — METHOCARBAMOL 500 MG PO TABS: 500.0000 mg | ORAL_TABLET | Freq: Four times a day (QID) | ORAL | 1 refills | Status: AC | PRN

## 2017-04-01 NOTE — Progress Notes (Signed)
Patient alert and oriented, mae's well, voiding adequate amount of urine, swallowing without difficulty, c/o mild pain at time of discharge. Patient discharged home with family. Script and discharged instructions given to patient. Patient and family stated understanding of instructions given. Patient has an appointment with Dr. Jenkins 

## 2017-04-01 NOTE — Discharge Summary (Signed)
Physician Discharge Summary  Patient ID: PARILEE HALLY MRN: [id number] DOB/AGE: [birth year] 51 y.o.  Admit date: 03/30/2017 Discharge date: 04/01/2017  Admission Diagnoses:L4-5 spondylolisthesis, spinal stenosis, lumbar radiculopathy, neurogenic claudication  Discharge Diagnoses: The same Active Problems:   Spondylolisthesis of lumbar region   Discharged Condition: good  Hospital Course: I performed an L4-5 decompression, instrumentation, and fusion on the patient on 03/30/2017. The surgery went well.  The patient's postoperative course was unremarkable. On postoperative day #2 the patient requested discharge home. She was given written and oral discharge instructions. All her questions were answered.  Consults: Physical therapy Significant Diagnostic Studies: None Treatments: L4-5 decompression, instrumentation, and fusion. Discharge Exam: Blood pressure 123/60, pulse 96, temperature 98.9 F (37.2 C), temperature source Oral, resp. rate 18, weight 108.1 kg (238 lb 5 oz), SpO2 94 %. The patient is alert and pleasant. Her strength is normal and lower extremities.  Disposition: Home  Discharge Instructions    Call MD for:  difficulty breathing, headache or visual disturbances    Complete by:  As directed    Call MD for:  extreme fatigue    Complete by:  As directed    Call MD for:  hives    Complete by:  As directed    Call MD for:  persistant dizziness or light-headedness    Complete by:  As directed    Call MD for:  persistant nausea and vomiting    Complete by:  As directed    Call MD for:  redness, tenderness, or signs of infection (pain, swelling, redness, odor or green/yellow discharge around incision site)    Complete by:  As directed    Call MD for:  severe uncontrolled pain    Complete by:  As directed    Call MD for:  temperature >100.4    Complete by:  As directed    Diet - low sodium heart healthy    Complete by:  As directed    Discharge instructions     Complete by:  As directed    Call [phone] for a followup appointment. Take a stool softener while you are using pain medications.   Driving Restrictions    Complete by:  As directed    Do not drive for 2 weeks.   Increase activity slowly    Complete by:  As directed    Lifting restrictions    Complete by:  As directed    Do not lift more than 5 pounds. No excessive bending or twisting.   May shower / Bathe    Complete by:  As directed    He may shower after the pain she is removed 3 days after surgery. Leave the incision alone.   Remove dressing in 24 hours    Complete by:  As directed      Allergies as of 04/01/2017   No Known Allergies     Medication List    STOP taking these medications   TURMERIC PO     TAKE these medications   FISH OIL PO Take 1 tablet by mouth 2 (two) times daily.   levothyroxine 75 MCG tablet Commonly known as:  SYNTHROID, LEVOTHROID Take 75 mcg by mouth daily before breakfast.   methocarbamol 500 MG tablet Commonly known as:  ROBAXIN Take 1 tablet (500 mg total) by mouth every 6 (six) hours as needed for muscle spasms.   oxyCODONE-acetaminophen 5-325 MG tablet Commonly known as:  PERCOCET/ROXICET Take 1-2 tablets by mouth every 4 (four) hours as  needed for moderate pain.   Vitamin D (Ergocalciferol) 50000 units Caps capsule Commonly known as:  DRISDOL Take 50,000 Units by mouth every 7 (seven) days. Sunday            Discharge Care Instructions        Start     Ordered   04/01/17 0000  oxyCODONE-acetaminophen (PERCOCET/ROXICET) 5-325 MG tablet  Every 4 hours PRN     04/01/17 0751   04/01/17 0000  methocarbamol (ROBAXIN) 500 MG tablet  Every 6 hours PRN     04/01/17 0751   04/01/17 0000  Discharge instructions    Comments:  Call [phone] for a followup appointment. Take a stool softener while you are using pain medications.   04/01/17 0751   04/01/17 0000  Increase activity slowly     04/01/17 0751   04/01/17 0000  May  shower / Bathe    Comments:  He may shower after the pain she is removed 3 days after surgery. Leave the incision alone.   04/01/17 0751   04/01/17 0000  Driving Restrictions    Comments:  Do not drive for 2 weeks.   04/01/17 0751   04/01/17 0000  Lifting restrictions    Comments:  Do not lift more than 5 pounds. No excessive bending or twisting.   04/01/17 0751   04/01/17 0000  Diet - low sodium heart healthy     04/01/17 0751   04/01/17 0000  Call MD for:  temperature >100.4     04/01/17 0751   04/01/17 0000  Call MD for:  persistant nausea and vomiting     04/01/17 0751   04/01/17 0000  Call MD for:  severe uncontrolled pain     04/01/17 0751   04/01/17 0000  Call MD for:  redness, tenderness, or signs of infection (pain, swelling, redness, odor or green/yellow discharge around incision site)     04/01/17 0751   04/01/17 0000  Call MD for:  difficulty breathing, headache or visual disturbances     04/01/17 0751   04/01/17 0000  Call MD for:  hives     04/01/17 0751   04/01/17 0000  Call MD for:  persistant dizziness or light-headedness     04/01/17 0751   04/01/17 0000  Call MD for:  extreme fatigue     04/01/17 0751   04/01/17 0000  Remove dressing in 24 hours     09 /07/18 0751       Signed: Tressie StalkerJENKINS,Blakleigh Straw D 04/01/2017, 7:52 AM

## 2017-04-01 NOTE — Progress Notes (Signed)
Physical Therapy Treatment Patient Details Name: Jody Shepard MRN: [id number] DOB: 02-May-1966 Today's Date: 04/01/2017    History of Present Illness s/p Bilateral L4-5 Laminotomy/foraminotomies to decompress the bilateral L4 and L5 nerve roots    PT Comments    Pt making steady progress with mobility. She continues to have 7/10 pain in her back but moving better this session. PT will continue to follow acutely to ensure a safe d/c home.   Follow Up Recommendations  No PT follow up;Supervision/Assistance - 24 hour     Equipment Recommendations  None recommended by PT    Recommendations for Other Services       Precautions / Restrictions Precautions Precautions: Back Precaution Comments: Reviewed 3/3 back precautions with pt Required Braces or Orthoses: Spinal Brace Spinal Brace: Lumbar corset Restrictions Weight Bearing Restrictions: No    Mobility  Bed Mobility               General bed mobility comments: Pt sitting in chair upon arrival  Transfers Overall transfer level: Needs assistance Equipment used: None Transfers: Sit to/from Stand Sit to Stand: Supervision         General transfer comment: supervision for safety  Ambulation/Gait Ambulation/Gait assistance: Supervision Ambulation Distance (Feet): 300 Feet Assistive device: None Gait Pattern/deviations: Step-through pattern;Decreased step length - right;Decreased step length - left;Decreased stride length;Shuffle;Wide base of support Gait velocity: decreased Gait velocity interpretation: Below normal speed for age/gender General Gait Details: very slow, cautious gait pattern, mild instability but no overt LOB or need for physical assistance, supervision for safety   Stairs            Wheelchair Mobility    Modified Rankin (Stroke Patients Only)       Balance Overall balance assessment: Needs assistance Sitting-balance support: Feet supported Sitting balance-Leahy Scale: Good      Standing balance support: During functional activity;No upper extremity supported Standing balance-Leahy Scale: Fair                              Cognition Arousal/Alertness: Awake/alert Behavior During Therapy: WFL for tasks assessed/performed Overall Cognitive Status: Within Functional Limits for tasks assessed                                        Exercises      General Comments General comments (skin integrity, edema, etc.): Pts daughter present during treatment session and reports being able to purchase AE for LB ADLs and toilet hygiene.      Pertinent Vitals/Pain Pain Assessment: 0-10 Pain Score: 7  Pain Location: back Pain Descriptors / Indicators: Aching;Sore Pain Intervention(s): Monitored during session;Repositioned    Home Living                      Prior Function            PT Goals (current goals can now be found in the care plan section) Acute Rehab PT Goals Patient Stated Goal: to go home and be as independent as possible PT Goal Formulation: With patient Time For Goal Achievement: 04/07/17 Potential to Achieve Goals: Good Progress towards PT goals: Progressing toward goals    Frequency    Min 5X/week      PT Plan Current plan remains appropriate    Co-evaluation  AM-PAC PT "6 Clicks" Daily Activity  Outcome Measure  Difficulty turning over in bed (including adjusting bedclothes, sheets and blankets)?: A Little Difficulty moving from lying on back to sitting on the side of the bed? : A Little Difficulty sitting down on and standing up from a chair with arms (e.g., wheelchair, bedside commode, etc,.)?: A Little Help needed moving to and from a bed to chair (including a wheelchair)?: None Help needed walking in hospital room?: None Help needed climbing 3-5 steps with a railing? : A Little 6 Click Score: 20    End of Session Equipment Utilized During Treatment: Gait belt;Back  brace Activity Tolerance: Patient tolerated treatment well Patient left: in chair;with call bell/phone within reach;with family/visitor present Nurse Communication: Mobility status PT Visit Diagnosis: Unsteadiness on feet (R26.81);Other abnormalities of gait and mobility (R26.89);Pain Pain - part of body:  (back)     Time: 1191-4782 PT Time Calculation (min) (ACUTE ONLY): 13 min  Charges:  $Gait Training: 8-22 mins                    G Codes:       [place], [place], [place] [phone]    Alessandra Bevels Duane Trias 04/01/2017, 9:27 AM

## 2017-04-01 NOTE — Therapy (Signed)
Occupational Therapy Treatment Patient Details Name: Jody Shepard MRN: [id number] DOB: Jan 18, 1966 Today's Date: 04/01/2017    History of present illness s/p Bilateral L4-5 Laminotomy/foraminotomies to decompress the bilateral L4 and L5 nerve roots   OT comments  Pt and family member educated on use of AE to increase independence with ADLs. Pt currently requires min assist for LB ADLs and min guard for functional mobility. Pt reports family is able to provided assistance as needed upon d/c home.    Follow Up Recommendations  No OT follow up;Supervision/Assistance - 24 hour    Equipment Recommendations  3 in 1 bedside commode    Recommendations for Other Services      Precautions / Restrictions Precautions Precautions: Back Precaution Comments: Pt able to recall 3/3 back precautions Required Braces or Orthoses: Spinal Brace Spinal Brace: Lumbar corset Restrictions Weight Bearing Restrictions: No       Mobility Bed Mobility               General bed mobility comments: Pt sitting in chair upon arrival  Transfers Overall transfer level: Needs assistance Equipment used: None Transfers: Sit to/from Stand Sit to Stand: Min guard         General transfer comment: Min guard for safety    Balance Overall balance assessment: Needs assistance Sitting-balance support: Feet supported Sitting balance-Leahy Scale: Good     Standing balance support: Bilateral upper extremity supported;During functional activity Standing balance-Leahy Scale: Fair                             ADL either performed or assessed with clinical judgement   ADL Overall ADL's : Needs assistance/impaired             Lower Body Bathing: Minimal assistance;Sit to/from stand Lower Body Bathing Details (indicate cue type and reason): Pt educated on use of long handle sponge to complete LB bathing.     Lower Body Dressing: Minimal assistance;Sit to/from stand Lower Body Dressing  Details (indicate cue type and reason): Pt educated on use of AE to complete LB ADLs. Toilet Transfer: Min guard;Ambulation;RW   Toileting- Clothing Manipulation and Hygiene: Minimal assistance;Sit to/from stand Toileting - Clothing Manipulation Details (indicate cue type and reason): Pt educated on AE to increase independence with toilet hygiene.     Functional mobility during ADLs: Min guard;Rolling walker General ADL Comments: Pt interested in AE to increase independence with ADLs.      Vision   Vision Assessment?: No apparent visual deficits   Perception     Praxis      Cognition Arousal/Alertness: Awake/alert Behavior During Therapy: WFL for tasks assessed/performed Overall Cognitive Status: Within Functional Limits for tasks assessed                                          Exercises     Shoulder Instructions       General Comments Pts daughter present during treatment session and reports being able to purchase AE for LB ADLs and toilet hygiene.    Pertinent Vitals/ Pain       Pain Assessment: 0-10 Pain Score: 7  Pain Location: back Pain Descriptors / Indicators: Aching;Sore Pain Intervention(s): Limited activity within patient's tolerance;Monitored during session  Home Living  Prior Functioning/Environment              Frequency  Min 2X/week        Progress Toward Goals  OT Goals(current goals can now be found in the care plan section)  Progress towards OT goals: Progressing toward goals  Acute Rehab OT Goals Patient Stated Goal: to go home and be as independent as possible OT Goal Formulation: With patient/family Time For Goal Achievement: 04/07/17 Potential to Achieve Goals: Good  Plan Discharge plan remains appropriate    Co-evaluation                 AM-PAC PT "6 Clicks" Daily Activity     Outcome Measure   Help from another person eating meals?:  None Help from another person taking care of personal grooming?: None Help from another person toileting, which includes using toliet, bedpan, or urinal?: A Little Help from another person bathing (including washing, rinsing, drying)?: A Little Help from another person to put on and taking off regular upper body clothing?: None Help from another person to put on and taking off regular lower body clothing?: A Little 6 Click Score: 21    End of Session Equipment Utilized During Treatment: Gait belt;Back brace;Rolling walker  OT Visit Diagnosis: Pain Pain - part of body:  (back)   Activity Tolerance Patient tolerated treatment well   Patient Left in chair;with call bell/phone within reach;with family/visitor present   Nurse Communication Mobility status        Time: 4098-1191 OT Time Calculation (min): 18 min  Charges: OT General Charges $OT Visit: 1 Visit OT Treatments $Self Care/Home Management : 8-22 mins  Cammy Copa, [place] #[phone]    Cammy Copa 04/01/2017, 9:11 AM

## 2018-02-18 IMAGING — RF DG LUMBAR SPINE 2-3V
1 series · 2 of 2 positions shown · non-contrast
Comparison: None.

CLINICAL DATA: PLIF

EXAM:
LUMBAR SPINE - 2-3 VIEW; DG C-ARM 61-120 MIN

[Series 1: run · 2 of 2 slices shown]
[im 1/2]
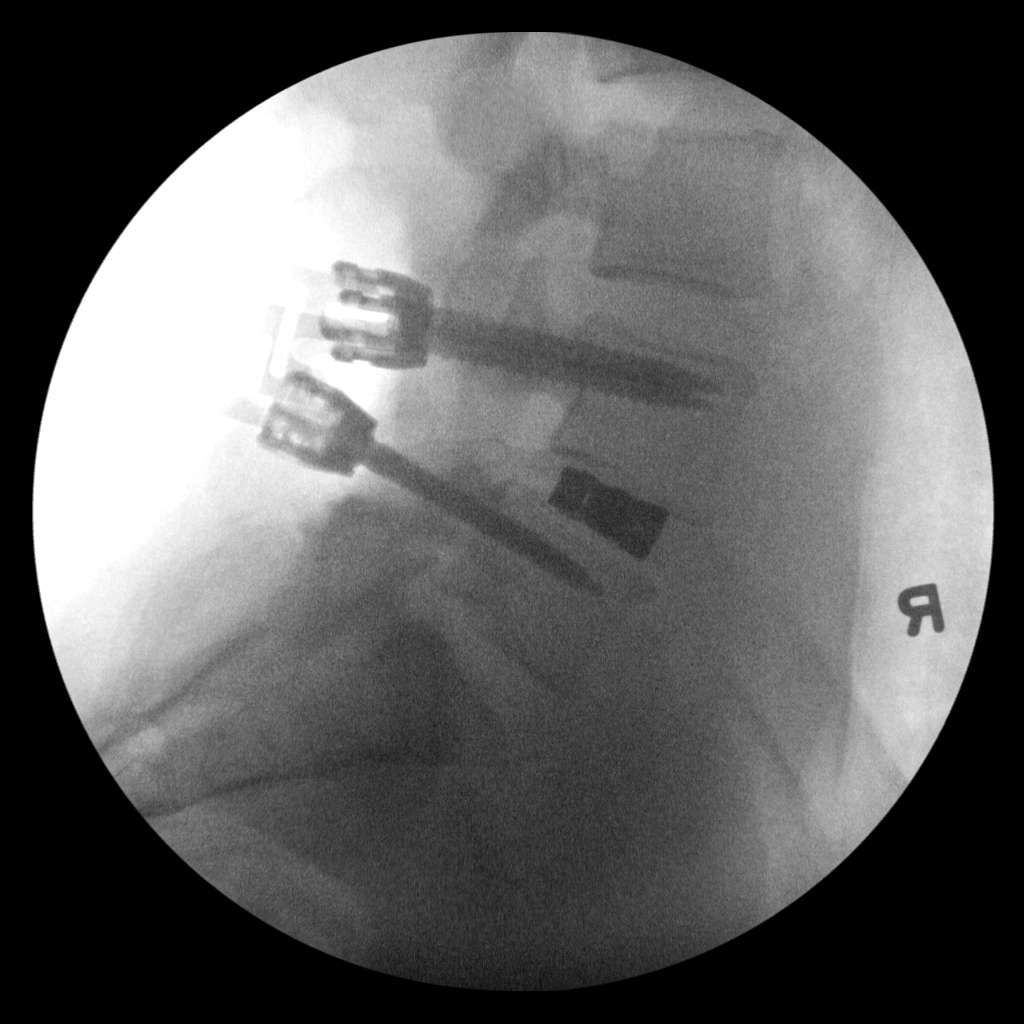
[im 2/2]
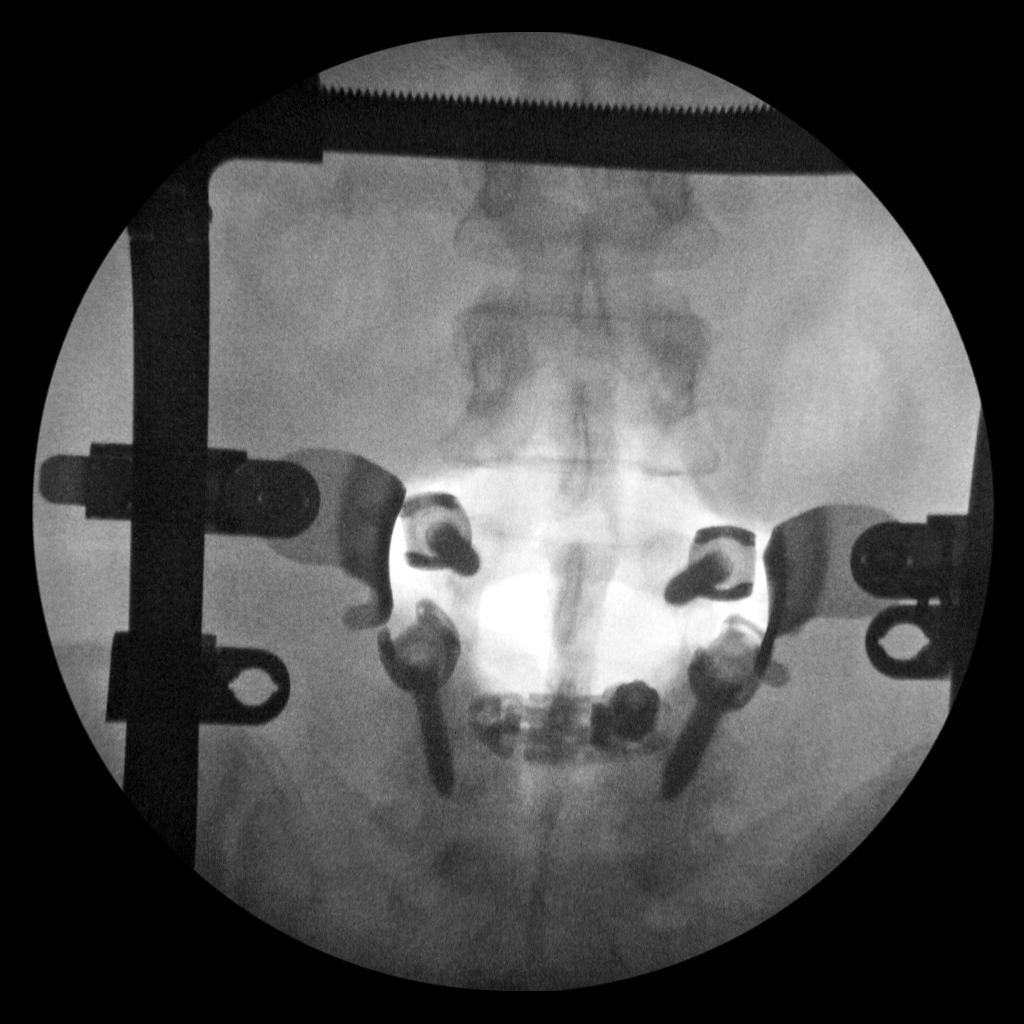

[2 of 2 positions shown; findings below may reference images not displayed]

FINDINGS: C-arm images show discectomy at L4-5 with posterior decompression, M
bilateral pedicle screws. Posterior rods are not yet in place.
IMPRESSION: Diskectomy, decompression and fusion in progress at L4-5.
